# Patient Record
Sex: Female | Born: 2000 | Race: White | Hispanic: No | Marital: Single | Smoking: Current every day smoker
Health system: Southern US, Community
[De-identification: ages and names within clinical notes are randomized; demographics above are authoritative.]

## PROBLEM LIST (undated history)

## (undated) DIAGNOSIS — J45909 Unspecified asthma, uncomplicated: Secondary | ICD-10-CM

## (undated) DIAGNOSIS — Z8742 Personal history of other diseases of the female genital tract: Secondary | ICD-10-CM

## (undated) DIAGNOSIS — R12 Heartburn: Secondary | ICD-10-CM

## (undated) DIAGNOSIS — N200 Calculus of kidney: Secondary | ICD-10-CM

## (undated) HISTORY — DX: Heartburn: R12

## (undated) HISTORY — DX: Personal history of other diseases of the female genital tract: Z87.42

## (undated) HISTORY — DX: Calculus of kidney: N20.0

## (undated) HISTORY — DX: Unspecified asthma, uncomplicated: J45.909

---
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
## 2019-04-01 ENCOUNTER — Emergency Department
Admission: EM | Admit: 2019-04-01 | Discharge: 2019-04-01 | Disposition: A | Discharge status: Home / Self Care | Payer: 59 | Attending: Emergency Medicine | Admitting: Emergency Medicine

## 2019-04-01 ENCOUNTER — Other Ambulatory Visit: Payer: Self-pay

## 2019-04-01 ENCOUNTER — Emergency Department: Payer: 59

## 2019-04-01 DIAGNOSIS — Y999 Unspecified external cause status: Secondary | ICD-10-CM | POA: Insufficient documentation

## 2019-04-01 DIAGNOSIS — S0083XA Contusion of other part of head, initial encounter: Secondary | ICD-10-CM | POA: Insufficient documentation

## 2019-04-01 DIAGNOSIS — Y939 Activity, unspecified: Secondary | ICD-10-CM | POA: Insufficient documentation

## 2019-04-01 DIAGNOSIS — R6884 Jaw pain: Secondary | ICD-10-CM | POA: Diagnosis present

## 2019-04-01 DIAGNOSIS — F17210 Nicotine dependence, cigarettes, uncomplicated: Secondary | ICD-10-CM | POA: Insufficient documentation

## 2019-04-01 DIAGNOSIS — G44319 Acute post-traumatic headache, not intractable: Secondary | ICD-10-CM

## 2019-04-01 DIAGNOSIS — Y929 Unspecified place or not applicable: Secondary | ICD-10-CM | POA: Insufficient documentation

## 2019-04-01 MED ORDER — NAPROXEN 500 MG PO TABS: 500.0000 mg | ORAL_TABLET | Freq: Two times a day (BID) | ORAL | 0 refills | Status: DC
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
## 2019-04-01 NOTE — Discharge Instructions (Addendum)
Follow-up with your primary care provider if any continued problems.  Begin taking naproxen 500 mg twice daily with food.  Remain on soft foods for 1 week.  If not improving he should follow-up with your primary care provider for reevaluation.  You may use ice to your face as needed for pain and if there is any swelling.

## 2020-03-17 ENCOUNTER — Emergency Department
Admission: EM | Admit: 2020-03-17 | Discharge: 2020-03-17 | Disposition: A | Discharge status: Home / Self Care | Payer: No Typology Code available for payment source | Attending: Emergency Medicine | Admitting: Emergency Medicine

## 2020-03-17 ENCOUNTER — Other Ambulatory Visit: Payer: Self-pay

## 2020-03-17 ENCOUNTER — Emergency Department: Payer: No Typology Code available for payment source

## 2020-03-17 DIAGNOSIS — M79642 Pain in left hand: Secondary | ICD-10-CM | POA: Diagnosis not present

## 2020-03-17 DIAGNOSIS — Y998 Other external cause status: Secondary | ICD-10-CM | POA: Diagnosis not present

## 2020-03-17 DIAGNOSIS — Y9389 Activity, other specified: Secondary | ICD-10-CM | POA: Insufficient documentation

## 2020-03-17 DIAGNOSIS — M546 Pain in thoracic spine: Secondary | ICD-10-CM | POA: Diagnosis not present

## 2020-03-17 DIAGNOSIS — J45909 Unspecified asthma, uncomplicated: Secondary | ICD-10-CM | POA: Insufficient documentation

## 2020-03-17 DIAGNOSIS — M79641 Pain in right hand: Secondary | ICD-10-CM | POA: Insufficient documentation

## 2020-03-17 DIAGNOSIS — S0003XA Contusion of scalp, initial encounter: Secondary | ICD-10-CM | POA: Insufficient documentation

## 2020-03-17 DIAGNOSIS — M79631 Pain in right forearm: Secondary | ICD-10-CM | POA: Insufficient documentation

## 2020-03-17 DIAGNOSIS — Y9289 Other specified places as the place of occurrence of the external cause: Secondary | ICD-10-CM | POA: Diagnosis not present

## 2020-03-17 DIAGNOSIS — F17200 Nicotine dependence, unspecified, uncomplicated: Secondary | ICD-10-CM | POA: Diagnosis not present

## 2020-03-17 DIAGNOSIS — M7918 Myalgia, other site: Secondary | ICD-10-CM

## 2020-03-17 DIAGNOSIS — S161XXA Strain of muscle, fascia and tendon at neck level, initial encounter: Secondary | ICD-10-CM

## 2020-03-17 DIAGNOSIS — R519 Headache, unspecified: Secondary | ICD-10-CM | POA: Diagnosis not present

## 2020-03-17 DIAGNOSIS — S199XXA Unspecified injury of neck, initial encounter: Secondary | ICD-10-CM | POA: Diagnosis present

## 2020-03-17 MED ORDER — TRAMADOL HCL 50 MG PO TABS
50.0000 mg | ORAL_TABLET | Freq: Once | ORAL | Status: AC
  Administered 2020-03-17: 50 mg via ORAL
  Filled 2020-03-17: qty 1

## 2020-03-17 MED ORDER — IBUPROFEN 600 MG PO TABS: 600.0000 mg | ORAL_TABLET | Freq: Three times a day (TID) | ORAL | 0 refills | Status: DC | PRN

## 2020-03-17 MED ORDER — CYCLOBENZAPRINE HCL 10 MG PO TABS: 10.0000 mg | ORAL_TABLET | Freq: Three times a day (TID) | ORAL | 0 refills | Status: DC | PRN

## 2020-03-17 MED ORDER — TRAMADOL HCL 50 MG PO TABS: 50.0000 mg | ORAL_TABLET | Freq: Four times a day (QID) | ORAL | 0 refills | Status: DC | PRN

## 2020-03-17 MED ORDER — CYCLOBENZAPRINE HCL 10 MG PO TABS
10.0000 mg | ORAL_TABLET | Freq: Once | ORAL | Status: AC
  Administered 2020-03-17: 10 mg via ORAL
  Filled 2020-03-17: qty 1

## 2020-03-17 MED ORDER — IBUPROFEN 600 MG PO TABS
600.0000 mg | ORAL_TABLET | Freq: Once | ORAL | Status: AC
  Administered 2020-03-17: 600 mg via ORAL
  Filled 2020-03-17: qty 1

## 2020-03-17 NOTE — ED Notes (Signed)
Pt transported to radiology.
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
## 2020-09-25 ENCOUNTER — Other Ambulatory Visit: Payer: Self-pay

## 2020-09-25 ENCOUNTER — Encounter: Payer: Self-pay | Admitting: Certified Nurse Midwife

## 2020-09-25 ENCOUNTER — Ambulatory Visit (INDEPENDENT_AMBULATORY_CARE_PROVIDER_SITE_OTHER): Payer: Self-pay | Admitting: Certified Nurse Midwife

## 2020-09-25 VITALS — BP 102/60 | HR 77 | Wt 113.0 lb

## 2020-09-25 DIAGNOSIS — O0990 Supervision of high risk pregnancy, unspecified, unspecified trimester: Secondary | ICD-10-CM

## 2020-09-25 DIAGNOSIS — O99321 Drug use complicating pregnancy, first trimester: Secondary | ICD-10-CM

## 2020-09-25 DIAGNOSIS — Z3490 Encounter for supervision of normal pregnancy, unspecified, unspecified trimester: Secondary | ICD-10-CM

## 2020-09-25 MED ORDER — GLYCOPYRROLATE 1 MG PO TABS: 1.0000 mg | ORAL_TABLET | Freq: Three times a day (TID) | ORAL | 0 refills | Status: AC

## 2020-09-25 MED ORDER — ONDANSETRON 8 MG PO TBDP: 8.0000 mg | ORAL_TABLET | Freq: Three times a day (TID) | ORAL | 0 refills | Status: AC | PRN

## 2020-09-25 MED ORDER — FAMOTIDINE 40 MG PO TABS: 40.0000 mg | ORAL_TABLET | Freq: Every day | ORAL | 2 refills | Status: AC

## 2020-09-25 MED ORDER — FAMOTIDINE 40 MG PO TABS: 40.0000 mg | ORAL_TABLET | Freq: Every day | ORAL | 2 refills | Status: DC

## 2020-09-25 MED ORDER — CYCLOBENZAPRINE HCL 10 MG PO TABS: 10.0000 mg | ORAL_TABLET | Freq: Three times a day (TID) | ORAL | 1 refills | Status: AC | PRN

## 2020-09-25 NOTE — Progress Notes (Deleted)
06/20/20 03/27/21 

## 2020-09-25 NOTE — Progress Notes (Signed)
History:   Taylor Shepherd is a 20 y.o. G1P0000 at [redacted]w[redacted]d by best clinical estimate being seen today for her first obstetrical visit.  Best guess for LMP is 06/20/20 but pt has irregular periods and this is a guess as she was in active addition at the time. Her obstetrical history is significant for substance abuse . Patient is unsure about her intent to keep this pregnancy, how she will feed this baby or postpartum contraception. She was under the impression she was infertile given hx of sex without birth control without pregnancy. Pregnancy history fully reviewed. 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 She had severe withdrawal symptoms so attempted subutex, but that did not help her withdrawal. She switched to oxycodone (10mg  tabs) and has weaned herself from 10-15/day to 2-4 pills/day.   Patient reports headache, heartburn, nausea and vomiting.   HISTORY: OB History  Gravida Para Term Preterm AB Living  1 0 0 0 0 0  SAB IAB Ectopic Multiple Live Births  0 0 0 0 0    # Outcome Date GA Lbr Len/2nd Weight Sex Delivery Anes PTL Lv  1 Current             Has not had a pap smear.  Past Medical History:  Diagnosis Date  . Asthma   . Heartburn   . History of ovarian cyst 05/2014   treated with OCPs  . Kidney stone    No past surgical history on file. Family History  Problem Relation Age of Onset  . Asthma Mother   . Migraines Mother   . Asthma Maternal Grandfather   . Stroke Paternal Grandmother   . Kidney cancer Neg Hx   . Prostate cancer Neg Hx   . Bladder Cancer Neg Hx    Social History   Tobacco Use  . Smoking status: Current Every Day Smoker  . Smokeless tobacco: Never Used  Substance Use Topics  . Alcohol use: No    Alcohol/week: 0.0 standard drinks  . Drug use: No   No Known Allergies Current Outpatient Medications on File Prior to Visit  Medication Sig Dispense Refill  . Prenatal Vit-Fe Fumarate-FA (PRENATAL VITAMIN PO) Take  by mouth.     No current facility-administered medications on file prior to visit.   Review of Systems Pertinent items noted in HPI and remainder of comprehensive ROS otherwise negative. Physical Exam:   Vitals:   09/25/20 1429  BP: 102/60  Pulse: 77  Weight: 113 lb (51.3 kg)   Fetal Heart Rate (bpm): 145   Bedside Ultrasound for FHR check: Viable intrauterine pregnancy with positive cardiac activity noted - fetus appeared to be late first to early second trimester.  Patient informed that the ultrasound is considered a limited obstetric ultrasound and is not intended to be a complete ultrasound exam.  Patient also informed that the ultrasound is not being completed with the intent of assessing for fetal or placental anomalies or any pelvic abnormalities.  Explained that the purpose of today's ultrasound is to assess for fetal heart rate.  Patient acknowledges the purpose of the exam and the limitations of the study.  Assessment:    Pregnancy: G1P0000 Patient Active Problem List   Diagnosis Date Noted  . Supervision of high risk pregnancy, antepartum 09/25/2020     Plan:    1. Supervision of high risk pregnancy, antepartum - [facility] AMB BABYSCRIPTS SCHEDULE OPTIMIZATION - Pt unsure if she wants to keep pregnancy, options  discussed, encouraged to go for U/S as soon as possible. Can make referral for adoption attorney if needed. - Pt feeling unwell (very nauseated) will return for OB panel at first U/S next week - Will begin prenatal vitamins when her nausea is stabilized  2. Pregnancy with fetus of unknown gestational age - Problem list reviewed and updated. - Genetic Screening discussed, First trimester screen, Quad screen and NIPS: requested. - Ultrasound discussed; fetal anatomic survey: ordered. - Anticipatory guidance about prenatal visits given including labs, ultrasounds, and testing. - Discussed usage of Babyscripts and virtual visits as additional source of managing and  completing prenatal visits in midst of coronavirus and pandemic.   - Encouraged to complete MyChart Registration for her ability to review results, send requests, and have questions addressed.  - The nature of [facility] with multiple MDs and Advanced Practice Providers was explained to patient; also emphasized that residents, students are part of our team. - Routine obstetric precautions reviewed. Encouraged to seek out care at office or emergency room [facility] preferred) for urgent and/or emergent concerns.  3. Substance abuse complicating pregnancy in first trimester, antepartum - Affirmed pt for getting herself off of heroin and weaning down her percocet. Asked what she needs to continue weaning herself down. Stated nausea and headaches are the worst  - Pepcid, zofran and robinul given for GI symptoms and spitting, flexeril given for headaches (pt instructed to take with 1000mg  of Tylenol at the very onset of a headache).  - Pt encouraged to reach out if she needs further assistance or wants to try MAT again  Return in about 4 weeks (around 10/23/2020) for IN-PERSON.    12/23/2020, MSN, CNM, IBCLC Certified Nurse Midwife, [facility]

## 2020-09-25 NOTE — Progress Notes (Signed)
[place] scheduled for 09/19/20 @ 2 PM; lab appt for same afternoon. Called pt to notify. Pt reports medications were not at pharmacy for pick-up. Called [facility]. Verified 2 medications are ready for pick-up. Pharmacy staff stated unable to fill Pepcid due to not being covered by insurance, would have to be reordered to preferred mail order pharmacy. Robinul out of stock. Pharmacy staff will call patient to explain.

## 2020-10-08 ENCOUNTER — Other Ambulatory Visit: Payer: Self-pay | Admitting: *Deleted

## 2020-10-08 DIAGNOSIS — O0990 Supervision of high risk pregnancy, unspecified, unspecified trimester: Secondary | ICD-10-CM

## 2020-10-09 ENCOUNTER — Ambulatory Visit: Admission: RE | Admit: 2020-10-09 | Payer: Self-pay | Source: Ambulatory Visit

## 2020-10-09 ENCOUNTER — Other Ambulatory Visit: Payer: Self-pay

## 2020-10-19 ENCOUNTER — Ambulatory Visit: Admission: RE | Admit: 2020-10-19 | Payer: No Typology Code available for payment source | Source: Ambulatory Visit

## 2020-10-23 ENCOUNTER — Ambulatory Visit
Admission: RE | Admit: 2020-10-23 | Discharge: 2020-10-23 | Disposition: A | Discharge status: Home / Self Care | Payer: BC Managed Care – PPO | Source: Ambulatory Visit | Attending: Certified Nurse Midwife | Admitting: Certified Nurse Midwife

## 2020-10-23 ENCOUNTER — Other Ambulatory Visit: Payer: Self-pay | Admitting: Certified Nurse Midwife

## 2020-10-23 ENCOUNTER — Ambulatory Visit (HOSPITAL_BASED_OUTPATIENT_CLINIC_OR_DEPARTMENT_OTHER): Payer: BC Managed Care – PPO

## 2020-10-23 ENCOUNTER — Other Ambulatory Visit: Payer: Self-pay

## 2020-10-23 DIAGNOSIS — Z3492 Encounter for supervision of normal pregnancy, unspecified, second trimester: Secondary | ICD-10-CM

## 2020-10-23 DIAGNOSIS — Z3490 Encounter for supervision of normal pregnancy, unspecified, unspecified trimester: Secondary | ICD-10-CM | POA: Insufficient documentation

## 2020-10-23 DIAGNOSIS — O0990 Supervision of high risk pregnancy, unspecified, unspecified trimester: Secondary | ICD-10-CM | POA: Insufficient documentation

## 2020-10-31 ENCOUNTER — Encounter: Payer: Self-pay | Admitting: Certified Nurse Midwife

## 2020-10-31 ENCOUNTER — Ambulatory Visit (INDEPENDENT_AMBULATORY_CARE_PROVIDER_SITE_OTHER): Payer: BC Managed Care – PPO | Admitting: Certified Nurse Midwife

## 2020-10-31 ENCOUNTER — Other Ambulatory Visit: Payer: Self-pay

## 2020-10-31 VITALS — BP 107/60 | HR 67 | Wt 114.1 lb

## 2020-10-31 DIAGNOSIS — O99322 Drug use complicating pregnancy, second trimester: Secondary | ICD-10-CM

## 2020-10-31 DIAGNOSIS — Z640 Problems related to unwanted pregnancy: Secondary | ICD-10-CM

## 2020-10-31 DIAGNOSIS — O0992 Supervision of high risk pregnancy, unspecified, second trimester: Secondary | ICD-10-CM

## 2020-10-31 DIAGNOSIS — Z3A19 19 weeks gestation of pregnancy: Secondary | ICD-10-CM

## 2020-10-31 NOTE — Progress Notes (Signed)
   PRENATAL VISIT NOTE  Subjective:  Taylor Shepherd is a 20 y.o. G1P0000 at [redacted]w[redacted]d being seen today for ongoing prenatal care.  She is currently monitored for the following issues for this high-risk pregnancy and has Supervision of high risk pregnancy, antepartum on their problem list.  Patient reports fatigue.  Contractions: Not present. Vag. Bleeding: None.  Movement: AbsentWants referral for abortion care and has questions about the process. Denies leaking of fluid.   The following portions of the patient's history were reviewed and updated as appropriate: allergies, current medications, past family history, past medical history, past social history, past surgical history and problem list.   Objective:   Vitals:   10/31/20 1008  BP: 107/60  Pulse: 67  Weight: 114 lb 1.6 oz (51.8 kg)   Fetal Status:     Movement: Absent   Declined FHT.  General:  Alert, oriented and cooperative. Patient is in no acute distress.  Skin: Skin is warm and dry. No rash noted.   Cardiovascular: Normal heart rate noted  Respiratory: Normal respiratory effort, no problems with respiration noted  Abdomen: Soft, gravid, appropriate for gestational age.  Pain/Pressure: Absent     Pelvic: Cervical exam deferred        Extremities: Normal range of motion.  Edema: None  Mental Status: Normal mood and affect. Normal behavior. Normal judgment and thought content.   Assessment and Plan:  Pregnancy: G1P0000 at [redacted]w[redacted]d 1. Supervision of high risk pregnancy in second trimester - Less nauseated, less headaches but still very fatigued and overall unwell feeling  2. [redacted] weeks gestation of pregnancy - pt declined FHR/measurements as she does not plan to keep the pregnancy  3. Substance abuse affecting pregnancy in second trimester, antepartum - has not relapsed to heroin, continues to take 3-4 percocet per day. Does not want to attempt MAT again  4. Unwanted pregnancy with plans for termination - pt planning to have  abortion, requested referral. Explained that we do not (as a policy) give referrals but reviewed potential locations, risks of the procedure and alternatives to abortion. Pt plans [facility] in [place] (she has friends nearby for after-care) today. Pt expressed ambivalence. Provided space for her to process feelings and offered nonjudgmental reassurance and resources should she decide to keep the pregnancy. - Pt experiencing significant social issues, will refer to [facility]   Follow up in one month either for post-IAB birth control discussion or routine OB care.  Return in about 4 weeks (around 11/28/2020) for IN-PERSON, LOB.  Bernerd Limbo, CNM

## 2020-11-23 NOTE — BH Specialist Note (Signed)
Pt did not arrive to video visit and did not answer the phone ; Unable to leave voice message as mailbox is full; Pt does not have MyChart, so unable to leave MyChart messag.

## 2020-11-28 ENCOUNTER — Encounter: Payer: BC Managed Care – PPO | Admitting: Certified Nurse Midwife

## 2020-11-28 ENCOUNTER — Encounter: Payer: Self-pay | Admitting: Family Medicine

## 2020-12-03 ENCOUNTER — Ambulatory Visit: Payer: BC Managed Care – PPO | Admitting: Clinical

## 2020-12-03 DIAGNOSIS — Z5329 Procedure and treatment not carried out because of patient's decision for other reasons: Secondary | ICD-10-CM

## 2020-12-03 DIAGNOSIS — Z91199 Patient's noncompliance with other medical treatment and regimen due to unspecified reason: Secondary | ICD-10-CM
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
## 2022-06-18 IMAGING — US US MFM OB COMP +14 WKS
1 series · 14 of 28 positions shown · non-contrast
Comparison: none

[Series 1: us mfm ob comp +14 wks · 14 of 34 slices shown]
[im 2/34]
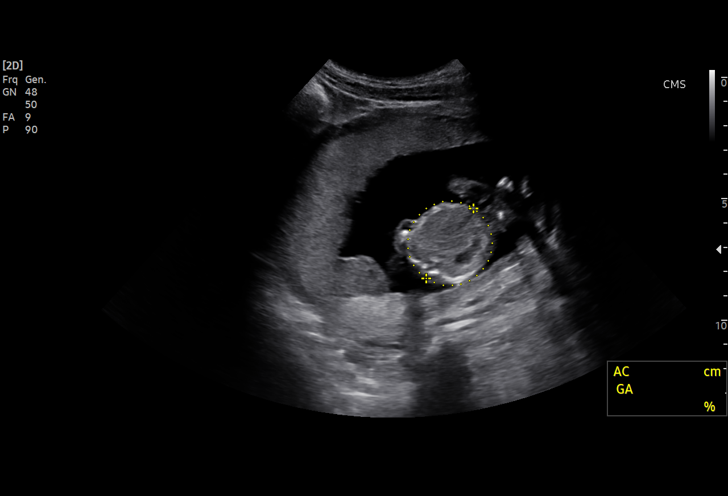
[im 4/34]
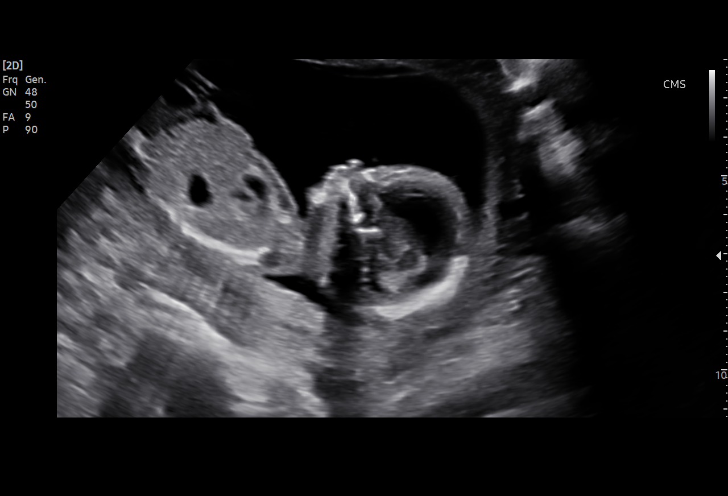
[im 7/34]
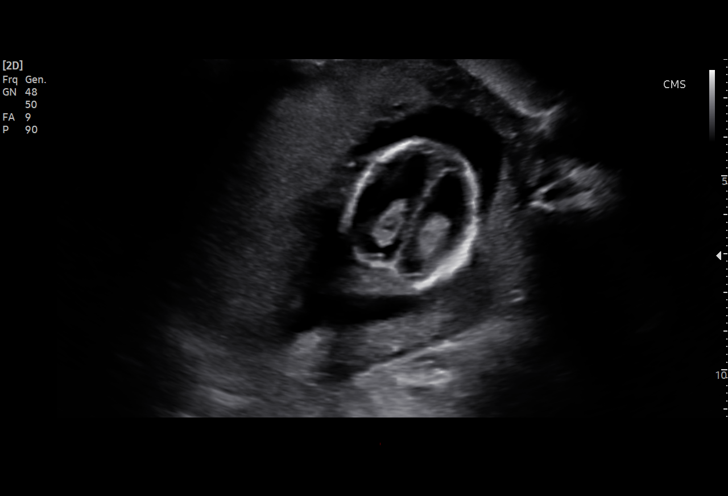
[im 9/34]
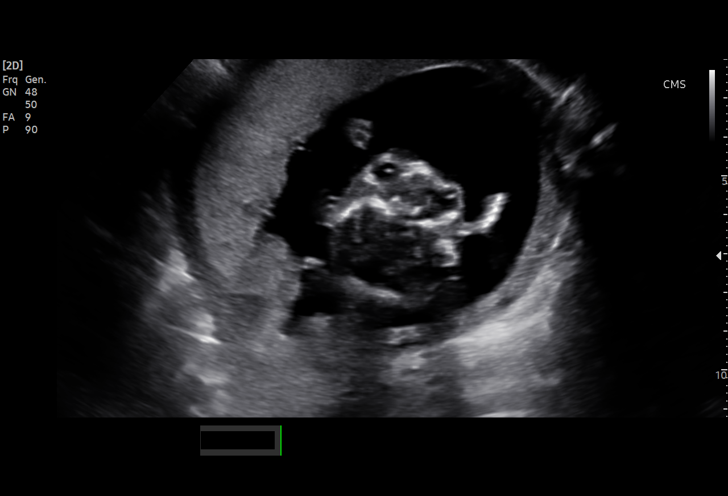
[im 12/34]
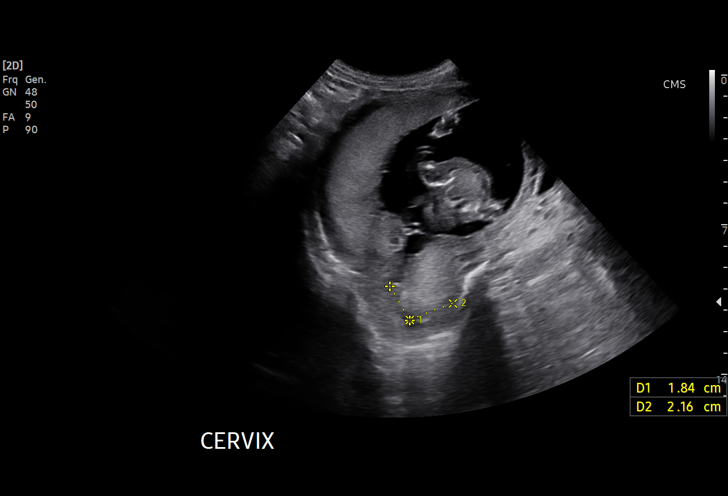
[im 14/34]
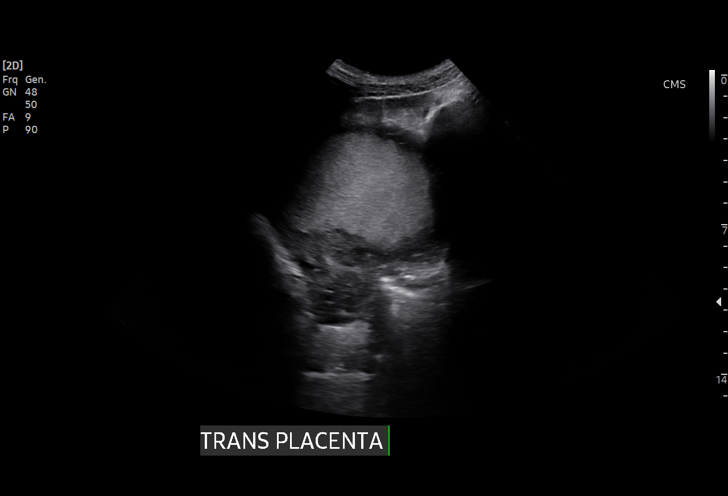
[im 16/34]
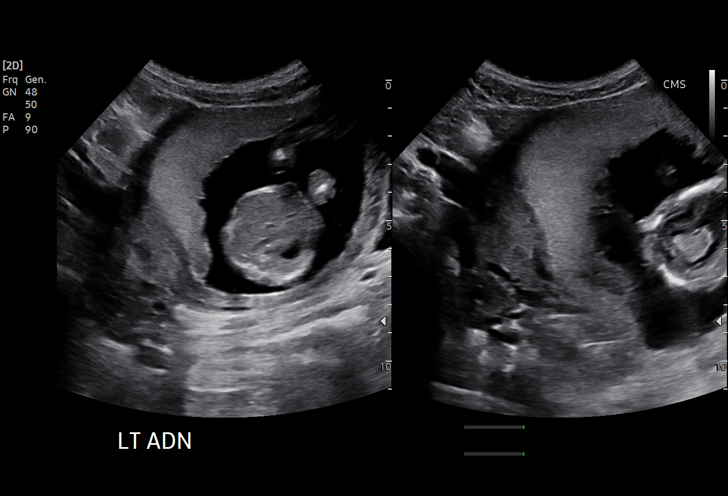
[im 19/34]
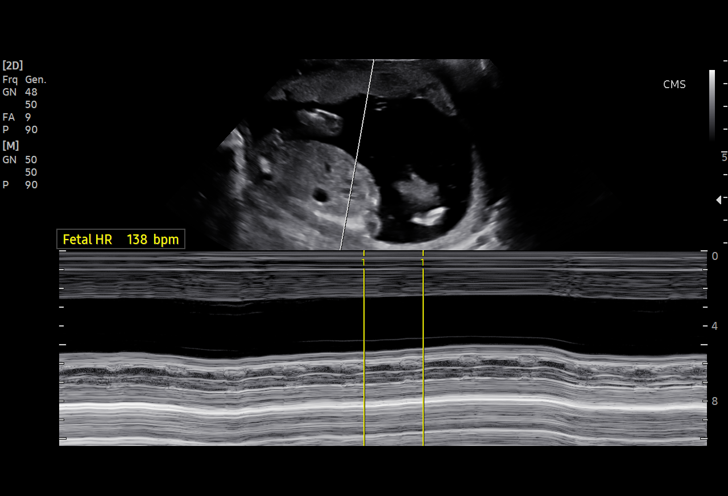
[im 21/34]
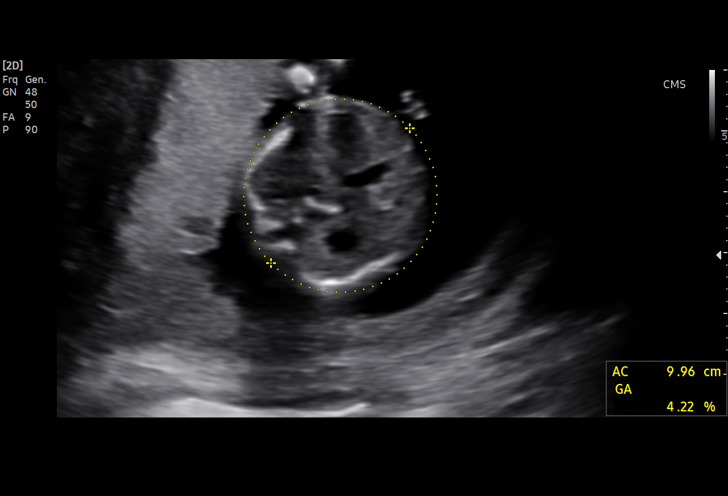
[im 24/34]
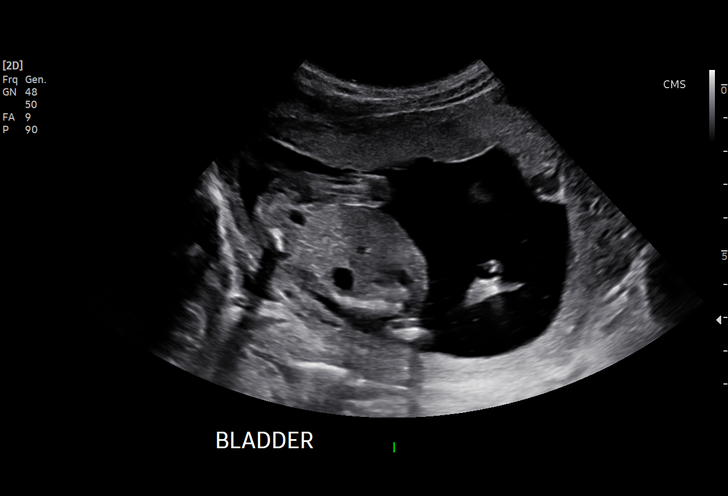
[im 26/34]
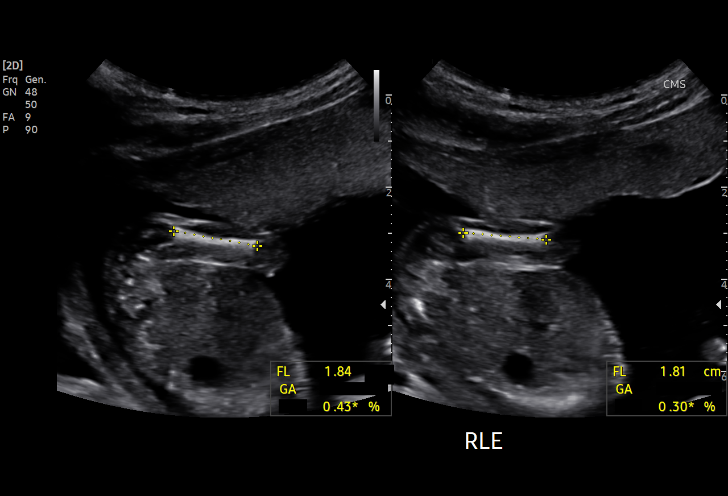
[im 29/34]
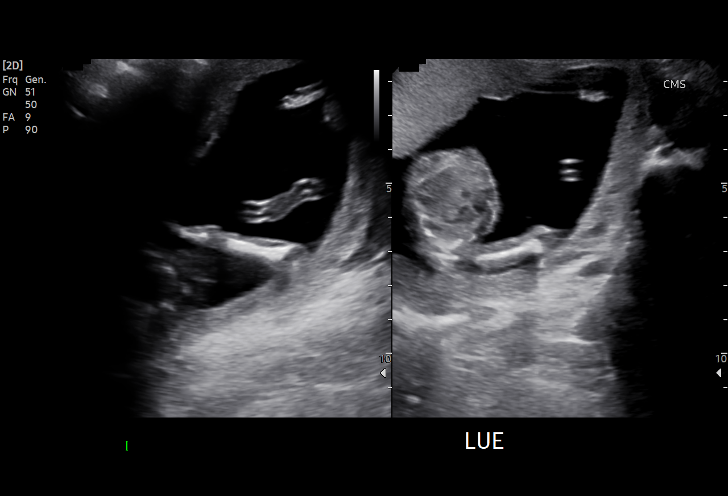
[im 31/34]
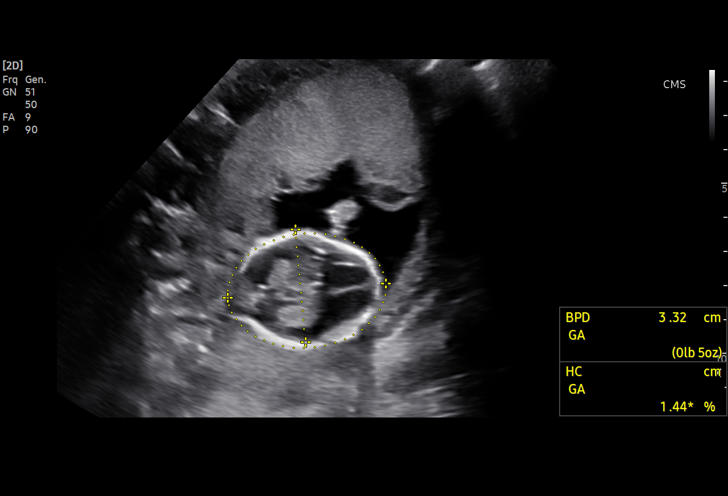
[im 34/34]
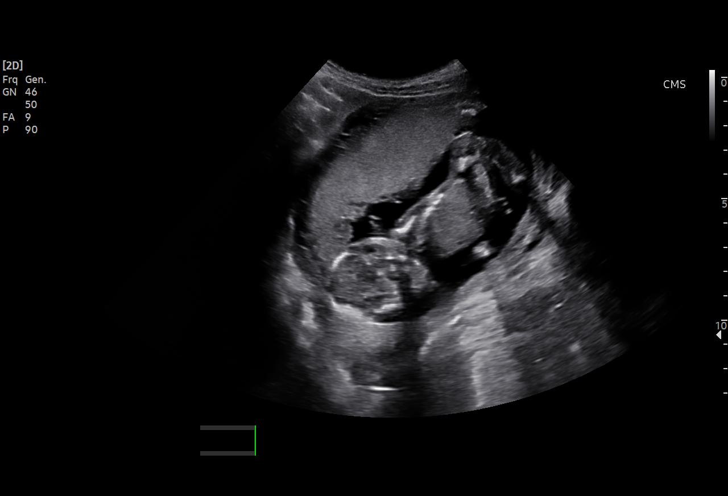

[14 of 28 positions shown; findings below may reference images not displayed]

1  US MFM OB COMP + 14 WK                76805.01    MIRAV AHRON

Indications

 Antenatal screening for malformations
 16 weeks gestation of pregnancy
 Encounter for uncertain dates
Fetal Evaluation

 Num Of Fetuses:         1
 Fetal Heart Rate(bpm):  138
 Cardiac Activity:       Observed
 Presentation:           Variable
 Placenta:               Posterior
 P. Cord Insertion:      Not well visualized

 Amniotic Fluid
 AFI FV:      Within normal limits
Biometry

 BPD:      32.4  mm     G. Age:  16w 1d         51  %    CI:        70.33   %    70 - 86
                                                         FL/HC:      14.8   %    13.3 -
 HC:      123.2  mm     G. Age:  16w 1d         45  %    HC/AC:      1.24        1.05 -
 AC:       99.5  mm     G. Age:  16w 0d         51  %    FL/BPD:     56.2   %
 FL:       18.2  mm     G. Age:  15w 3d         22  %    FL/AC:      18.3   %    20 - 24

 Est. FW:     134  gm      0 lb 5 oz     26  %
OB History
 Gravidity:    1         Term:   0        Prem:   0        SAB:   0
 TOP:          0       Ectopic:  0        Living: 0
Gestational Age

 LMP:           17w 6d        Date:  06/20/20                 EDD:   03/27/21
 U/S Today:     16w 0d                                        EDD:   04/09/21
 Best:          16w 0d     Det. By:  U/S (10/23/20)           EDD:   04/09/21
Anatomy

 Cranium:               Appears normal         LVOT:                   Not well visualized
 Cavum:                 Not well visualized    Aortic Arch:            Not well visualized
 Ventricles:            Not well visualized    Ductal Arch:            Not well visualized
 Choroid Plexus:        Visualized             Diaphragm:              Not well visualized
 Cerebellum:            Not well visualized    Stomach:                Appears normal, left
                                                                       sided
 Posterior Fossa:       Not well visualized    Abdomen:                Appears normal
 Nuchal Fold:           Not well visualized    Abdominal Wall:         Not well visualized
 Face:                  Appears normal         Cord Vessels:           Not well visualized
                        (orbits and profile)
 Lips:                  Not well visualized    Kidneys:                Appear normal
 Palate:                Not well visualized    Bladder:                Appears normal
 Thoracic:              Appears normal         Spine:                  Not well visualized
 Heart:                 Not well visualized    Upper Extremities:      Appears normal
 RVOT:                  Not well visualized    Lower Extremities:      Appears normal

 Other:  Nasal bone visualized. Lenses visualized. Left heel visualized.
         Technically difficult due to early gestational age.
Cervix Uterus Adnexa

 Cervix
 Length:            3.2  cm.
 Normal appearance by transabdominal scan.

 Uterus
 No abnormality visualized.

 Right Ovary
 Within normal limits.

 Left Ovary
 Not visualized.

 Cul De Sac
 No free fluid seen.

 Adnexa
 No abnormality visualized.
Comments

 This patient was seen for a dating ultrasound.

 Based on the fetal biometry measurements obtained today,
 her EDC is April 09, 2021, making her 16 weeks and 0
 days.

 The views of the fetal anatomy were limited today due to her
 early gestational age.

 A fetal anatomy scan has been scheduled for her at around
 19 weeks.
